# Patient Record
Sex: Female | Born: 1965 | Race: White | Hispanic: No | Marital: Married
Health system: Southern US, Community
[De-identification: ages and names within clinical notes are randomized; demographics above are authoritative.]

## PROBLEM LIST (undated history)

## (undated) DIAGNOSIS — I10 Essential (primary) hypertension: Secondary | ICD-10-CM

## (undated) DIAGNOSIS — K219 Gastro-esophageal reflux disease without esophagitis: Secondary | ICD-10-CM

## (undated) DIAGNOSIS — E785 Hyperlipidemia, unspecified: Secondary | ICD-10-CM

## (undated) DIAGNOSIS — E119 Type 2 diabetes mellitus without complications: Secondary | ICD-10-CM

## (undated) HISTORY — PX: CHOLECYSTECTOMY: SHX55

## (undated) HISTORY — DX: Gastro-esophageal reflux disease without esophagitis: K21.9

## (undated) HISTORY — DX: Essential (primary) hypertension: I10

## (undated) HISTORY — DX: Type 2 diabetes mellitus without complications: E11.9

## (undated) HISTORY — DX: Hyperlipidemia, unspecified: E78.5

---

## 1998-04-13 ENCOUNTER — Other Ambulatory Visit: Admission: RE | Admit: 1998-04-13 | Discharge: 1998-04-13 | Payer: Self-pay | Admitting: Family Medicine

## 1999-04-07 ENCOUNTER — Other Ambulatory Visit: Admission: RE | Admit: 1999-04-07 | Discharge: 1999-04-07 | Payer: Self-pay | Admitting: Family Medicine

## 2000-04-24 ENCOUNTER — Other Ambulatory Visit: Admission: RE | Admit: 2000-04-24 | Discharge: 2000-04-24 | Payer: Self-pay | Admitting: Family Medicine

## 2001-05-08 ENCOUNTER — Other Ambulatory Visit: Admission: RE | Admit: 2001-05-08 | Discharge: 2001-05-08 | Payer: Self-pay | Admitting: Family Medicine

## 2001-10-10 ENCOUNTER — Encounter: Admission: RE | Admit: 2001-10-10 | Discharge: 2001-10-10 | Payer: Self-pay | Admitting: Family Medicine

## 2001-10-10 ENCOUNTER — Encounter: Payer: Self-pay | Admitting: Family Medicine

## 2002-05-21 ENCOUNTER — Other Ambulatory Visit: Admission: RE | Admit: 2002-05-21 | Discharge: 2002-05-21 | Payer: Self-pay | Admitting: Family Medicine

## 2003-06-03 ENCOUNTER — Encounter: Admission: RE | Admit: 2003-06-03 | Discharge: 2003-06-03 | Payer: Self-pay | Admitting: Family Medicine

## 2003-12-22 ENCOUNTER — Encounter: Admission: RE | Admit: 2003-12-22 | Discharge: 2003-12-22 | Payer: Self-pay | Admitting: Family Medicine

## 2004-07-28 ENCOUNTER — Other Ambulatory Visit: Admission: RE | Admit: 2004-07-28 | Discharge: 2004-07-28 | Payer: Self-pay | Admitting: Family Medicine

## 2004-07-28 ENCOUNTER — Encounter: Admission: RE | Admit: 2004-07-28 | Discharge: 2004-07-28 | Payer: Self-pay | Admitting: Family Medicine

## 2004-08-04 ENCOUNTER — Encounter: Admission: RE | Admit: 2004-08-04 | Discharge: 2004-08-04 | Payer: Self-pay | Admitting: Family Medicine

## 2005-08-22 ENCOUNTER — Other Ambulatory Visit: Admission: RE | Admit: 2005-08-22 | Discharge: 2005-08-22 | Payer: Self-pay | Admitting: Family Medicine

## 2005-08-22 ENCOUNTER — Encounter: Admission: RE | Admit: 2005-08-22 | Discharge: 2005-08-22 | Payer: Self-pay | Admitting: Family Medicine

## 2005-09-23 ENCOUNTER — Encounter: Admission: RE | Admit: 2005-09-23 | Discharge: 2005-09-23 | Payer: Self-pay | Admitting: Family Medicine

## 2006-10-16 ENCOUNTER — Encounter: Admission: RE | Admit: 2006-10-16 | Discharge: 2006-10-16 | Payer: Self-pay | Admitting: Family Medicine

## 2006-10-16 ENCOUNTER — Other Ambulatory Visit: Admission: RE | Admit: 2006-10-16 | Discharge: 2006-10-16 | Payer: Self-pay | Admitting: Family Medicine

## 2008-01-16 ENCOUNTER — Encounter: Admission: RE | Admit: 2008-01-16 | Discharge: 2008-01-16 | Payer: Self-pay | Admitting: Family Medicine

## 2008-01-23 ENCOUNTER — Encounter: Admission: RE | Admit: 2008-01-23 | Discharge: 2008-01-23 | Payer: Self-pay | Admitting: Family Medicine

## 2008-01-28 ENCOUNTER — Other Ambulatory Visit: Admission: RE | Admit: 2008-01-28 | Discharge: 2008-01-28 | Payer: Self-pay | Admitting: Family Medicine

## 2008-12-22 ENCOUNTER — Other Ambulatory Visit: Admission: RE | Admit: 2008-12-22 | Discharge: 2008-12-22 | Payer: Self-pay | Admitting: Obstetrics and Gynecology

## 2009-01-16 ENCOUNTER — Encounter: Admission: RE | Admit: 2009-01-16 | Discharge: 2009-01-16 | Payer: Self-pay | Admitting: Family Medicine

## 2010-01-18 ENCOUNTER — Encounter: Admission: RE | Admit: 2010-01-18 | Discharge: 2010-01-18 | Payer: Self-pay | Admitting: Family Medicine

## 2010-05-02 ENCOUNTER — Encounter: Payer: Self-pay | Admitting: Family Medicine

## 2010-05-05 ENCOUNTER — Other Ambulatory Visit
Admission: RE | Admit: 2010-05-05 | Discharge: 2010-05-05 | Payer: Self-pay | Source: Home / Self Care | Admitting: Family Medicine

## 2010-05-05 ENCOUNTER — Other Ambulatory Visit: Payer: Self-pay | Admitting: Family Medicine

## 2011-01-07 ENCOUNTER — Other Ambulatory Visit: Payer: Self-pay | Admitting: Family Medicine

## 2011-01-07 DIAGNOSIS — Z1231 Encounter for screening mammogram for malignant neoplasm of breast: Secondary | ICD-10-CM

## 2011-01-24 ENCOUNTER — Ambulatory Visit: Payer: Self-pay

## 2011-02-03 ENCOUNTER — Ambulatory Visit
Admission: RE | Admit: 2011-02-03 | Discharge: 2011-02-03 | Disposition: A | Discharge status: Home / Self Care | Payer: 59 | Source: Ambulatory Visit | Attending: Family Medicine | Admitting: Family Medicine

## 2011-02-03 DIAGNOSIS — Z1231 Encounter for screening mammogram for malignant neoplasm of breast: Secondary | ICD-10-CM

## 2011-02-09 ENCOUNTER — Other Ambulatory Visit: Payer: Self-pay | Admitting: Family Medicine

## 2011-02-09 DIAGNOSIS — R928 Other abnormal and inconclusive findings on diagnostic imaging of breast: Secondary | ICD-10-CM

## 2011-02-23 ENCOUNTER — Ambulatory Visit
Admission: RE | Admit: 2011-02-23 | Discharge: 2011-02-23 | Disposition: A | Discharge status: Home / Self Care | Payer: 59 | Source: Ambulatory Visit | Attending: Family Medicine | Admitting: Family Medicine

## 2011-02-23 DIAGNOSIS — R928 Other abnormal and inconclusive findings on diagnostic imaging of breast: Secondary | ICD-10-CM

## 2011-06-01 ENCOUNTER — Other Ambulatory Visit (HOSPITAL_COMMUNITY)
Admission: RE | Admit: 2011-06-01 | Discharge: 2011-06-01 | Disposition: A | Discharge status: Home / Self Care | Payer: Commercial Indemnity | Source: Ambulatory Visit | Attending: Obstetrics and Gynecology | Admitting: Obstetrics and Gynecology

## 2011-06-01 ENCOUNTER — Other Ambulatory Visit: Payer: Self-pay | Admitting: Nurse Practitioner

## 2011-06-01 DIAGNOSIS — Z01419 Encounter for gynecological examination (general) (routine) without abnormal findings: Secondary | ICD-10-CM | POA: Insufficient documentation

## 2011-06-01 DIAGNOSIS — R87810 Cervical high risk human papillomavirus (HPV) DNA test positive: Secondary | ICD-10-CM | POA: Insufficient documentation

## 2012-01-19 ENCOUNTER — Other Ambulatory Visit: Payer: Self-pay | Admitting: Family Medicine

## 2012-01-19 DIAGNOSIS — Z1231 Encounter for screening mammogram for malignant neoplasm of breast: Secondary | ICD-10-CM

## 2012-02-24 ENCOUNTER — Ambulatory Visit
Admission: RE | Admit: 2012-02-24 | Discharge: 2012-02-24 | Disposition: A | Discharge status: Home / Self Care | Payer: PRIVATE HEALTH INSURANCE | Source: Ambulatory Visit | Attending: Family Medicine | Admitting: Family Medicine

## 2012-02-24 DIAGNOSIS — Z1231 Encounter for screening mammogram for malignant neoplasm of breast: Secondary | ICD-10-CM

## 2012-05-31 ENCOUNTER — Other Ambulatory Visit (HOSPITAL_COMMUNITY)
Admission: RE | Admit: 2012-05-31 | Discharge: 2012-05-31 | Disposition: A | Discharge status: Home / Self Care | Payer: PRIVATE HEALTH INSURANCE | Source: Ambulatory Visit | Attending: Obstetrics and Gynecology | Admitting: Obstetrics and Gynecology

## 2012-05-31 ENCOUNTER — Other Ambulatory Visit: Payer: Self-pay | Admitting: Nurse Practitioner

## 2012-05-31 DIAGNOSIS — Z01419 Encounter for gynecological examination (general) (routine) without abnormal findings: Secondary | ICD-10-CM | POA: Insufficient documentation

## 2012-05-31 DIAGNOSIS — Z1151 Encounter for screening for human papillomavirus (HPV): Secondary | ICD-10-CM | POA: Insufficient documentation

## 2012-09-13 ENCOUNTER — Other Ambulatory Visit: Payer: Self-pay | Admitting: Obstetrics and Gynecology

## 2012-09-13 DIAGNOSIS — N644 Mastodynia: Secondary | ICD-10-CM

## 2012-09-13 DIAGNOSIS — N6320 Unspecified lump in the left breast, unspecified quadrant: Secondary | ICD-10-CM

## 2012-09-27 ENCOUNTER — Ambulatory Visit
Admission: RE | Admit: 2012-09-27 | Discharge: 2012-09-27 | Disposition: A | Discharge status: Home / Self Care | Payer: PRIVATE HEALTH INSURANCE | Source: Ambulatory Visit | Attending: Obstetrics and Gynecology | Admitting: Obstetrics and Gynecology

## 2012-09-27 DIAGNOSIS — N644 Mastodynia: Secondary | ICD-10-CM

## 2012-09-27 DIAGNOSIS — N6320 Unspecified lump in the left breast, unspecified quadrant: Secondary | ICD-10-CM

## 2013-02-05 ENCOUNTER — Other Ambulatory Visit: Payer: Self-pay | Admitting: Family Medicine

## 2013-02-05 DIAGNOSIS — K921 Melena: Secondary | ICD-10-CM

## 2013-02-07 ENCOUNTER — Ambulatory Visit
Admission: RE | Admit: 2013-02-07 | Discharge: 2013-02-07 | Disposition: A | Discharge status: Home / Self Care | Payer: 59 | Source: Ambulatory Visit | Attending: Family Medicine | Admitting: Family Medicine

## 2013-02-07 DIAGNOSIS — K921 Melena: Secondary | ICD-10-CM

## 2013-02-07 MED ORDER — IOHEXOL 300 MG/ML  SOLN
100.0000 mL | Freq: Once | INTRAMUSCULAR | Status: AC | PRN
  Administered 2013-02-07: 100 mL via INTRAVENOUS

## 2013-04-15 ENCOUNTER — Other Ambulatory Visit: Payer: Self-pay | Admitting: Physician Assistant

## 2013-04-15 DIAGNOSIS — N63 Unspecified lump in unspecified breast: Secondary | ICD-10-CM

## 2013-04-18 ENCOUNTER — Ambulatory Visit
Admission: RE | Admit: 2013-04-18 | Discharge: 2013-04-18 | Disposition: A | Discharge status: Home / Self Care | Payer: PRIVATE HEALTH INSURANCE | Source: Ambulatory Visit | Attending: Physician Assistant | Admitting: Physician Assistant

## 2013-04-18 DIAGNOSIS — N63 Unspecified lump in unspecified breast: Secondary | ICD-10-CM

## 2014-05-21 ENCOUNTER — Other Ambulatory Visit: Payer: Self-pay

## 2014-05-21 DIAGNOSIS — Z1231 Encounter for screening mammogram for malignant neoplasm of breast: Secondary | ICD-10-CM

## 2014-06-06 ENCOUNTER — Other Ambulatory Visit: Payer: Self-pay

## 2014-06-06 ENCOUNTER — Ambulatory Visit
Admission: RE | Admit: 2014-06-06 | Discharge: 2014-06-06 | Disposition: A | Discharge status: Home / Self Care | Payer: 59 | Source: Ambulatory Visit

## 2014-06-06 ENCOUNTER — Encounter (INDEPENDENT_AMBULATORY_CARE_PROVIDER_SITE_OTHER): Payer: Self-pay

## 2014-06-06 DIAGNOSIS — Z1231 Encounter for screening mammogram for malignant neoplasm of breast: Secondary | ICD-10-CM

## 2014-10-29 IMAGING — US US BREAST*L*
1 series · 6 of 6 positions shown · non-contrast
Comparison: 02/24/2012, 02/23/2011, 02/03/2011, 01/18/2010.

CLINICAL DATA: Referring physician notes palpable mass within the
superior left breast at the 12 o'clock position.  There is a family
history of breast cancer in a paternal aunt.

DIGITAL DIAGNOSTIC LEFT BREAST MAMMOGRAM WITH CAD AND LEFT BREAST
ULTRASOUND:

[Series 1: us breast*left* · 6 of 6 slices shown]
[im 1/6]
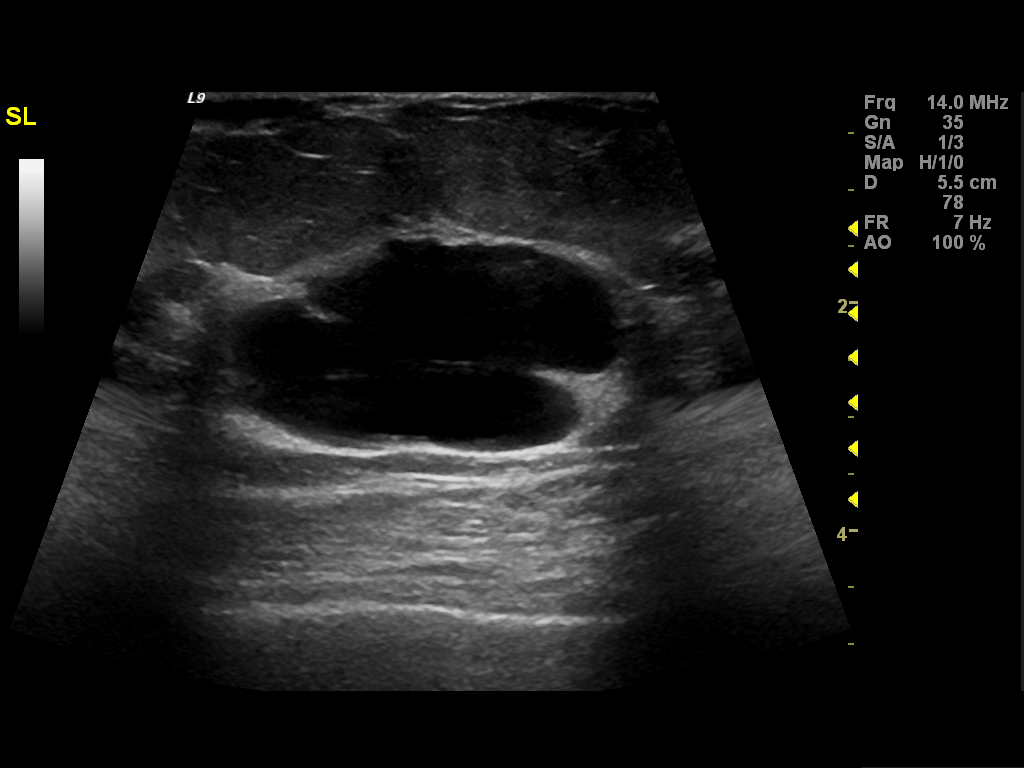
[im 2/6]
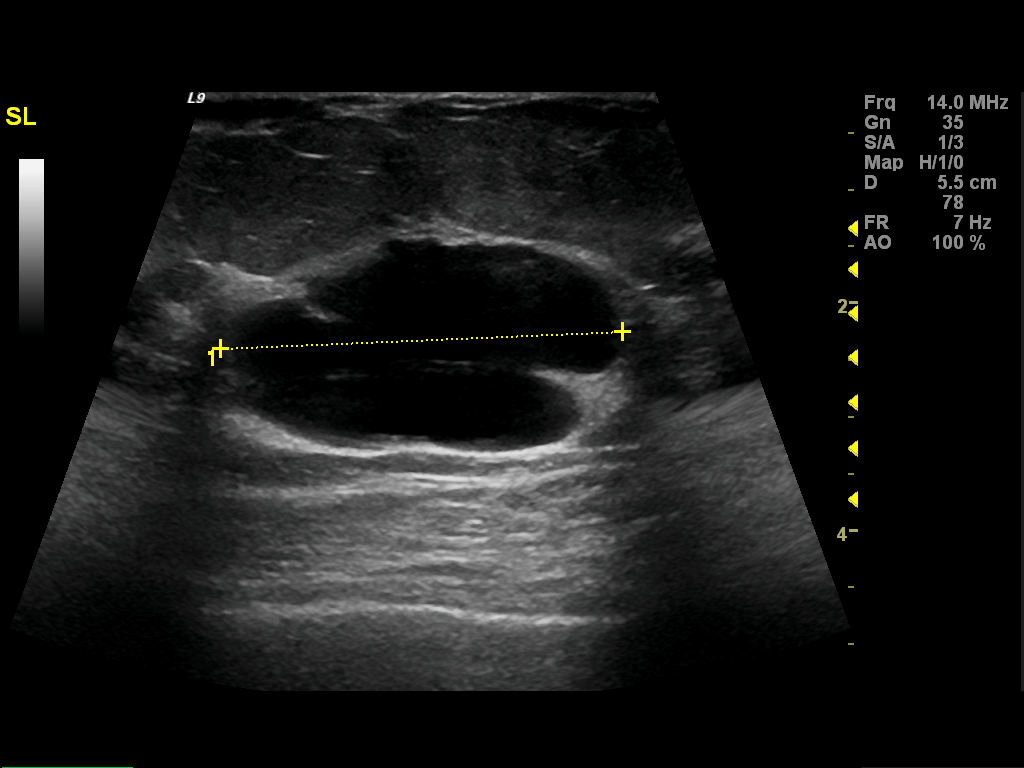
[im 3/6]
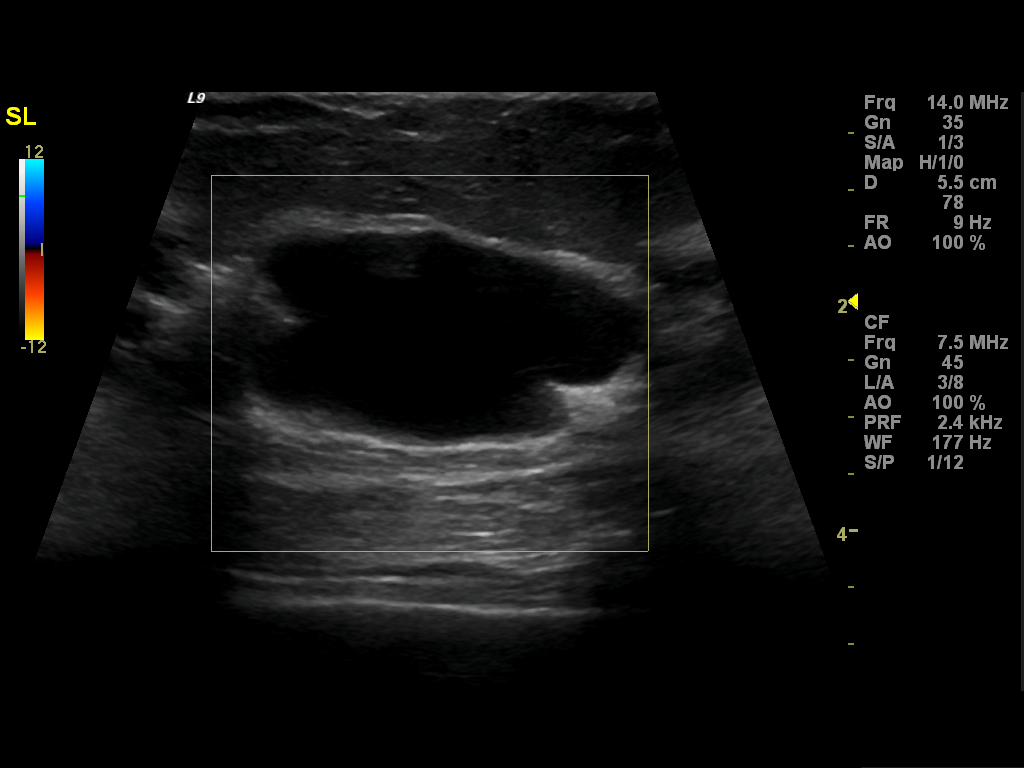
[im 4/6]
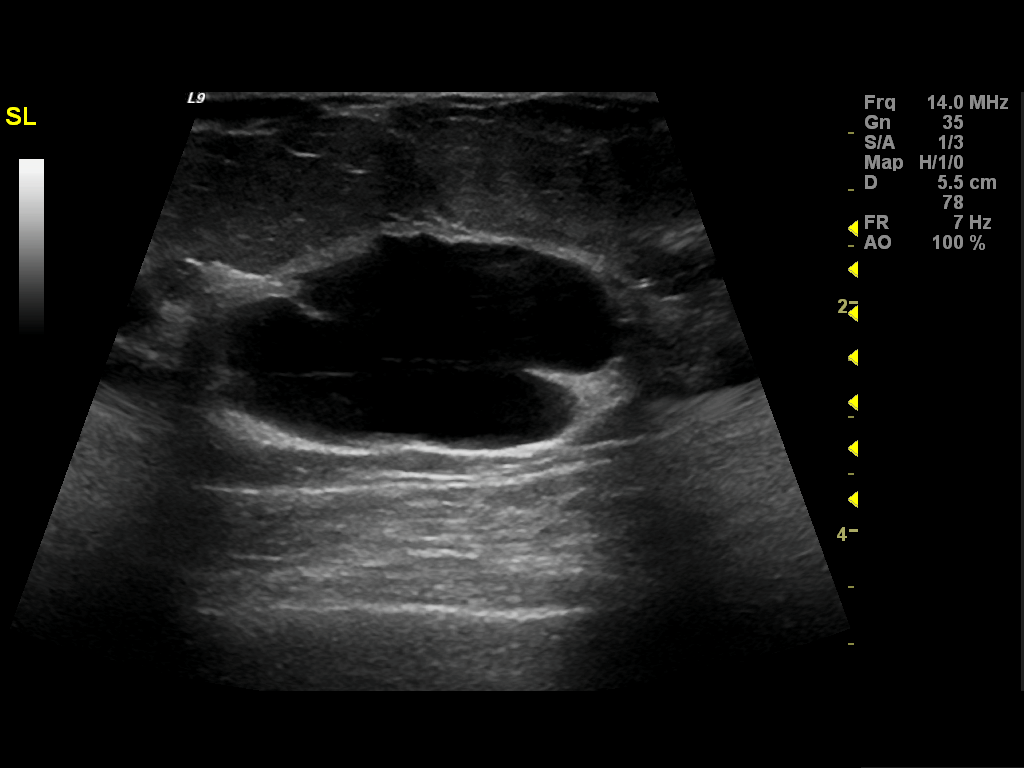
[im 5/6]
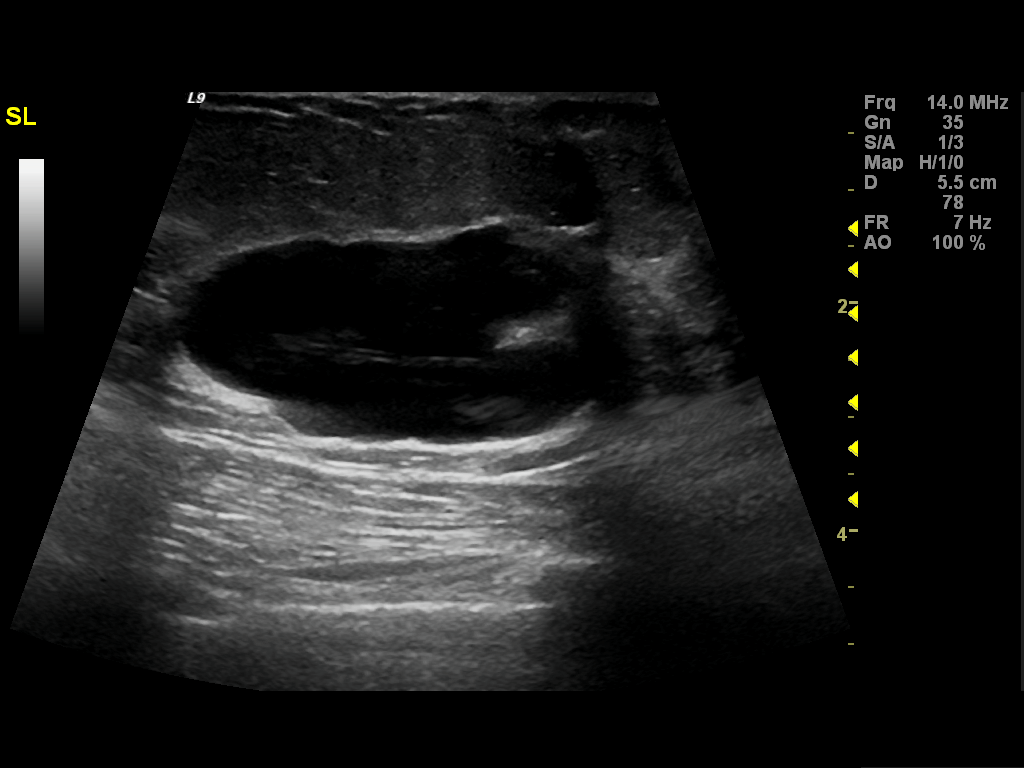
[im 6/6]
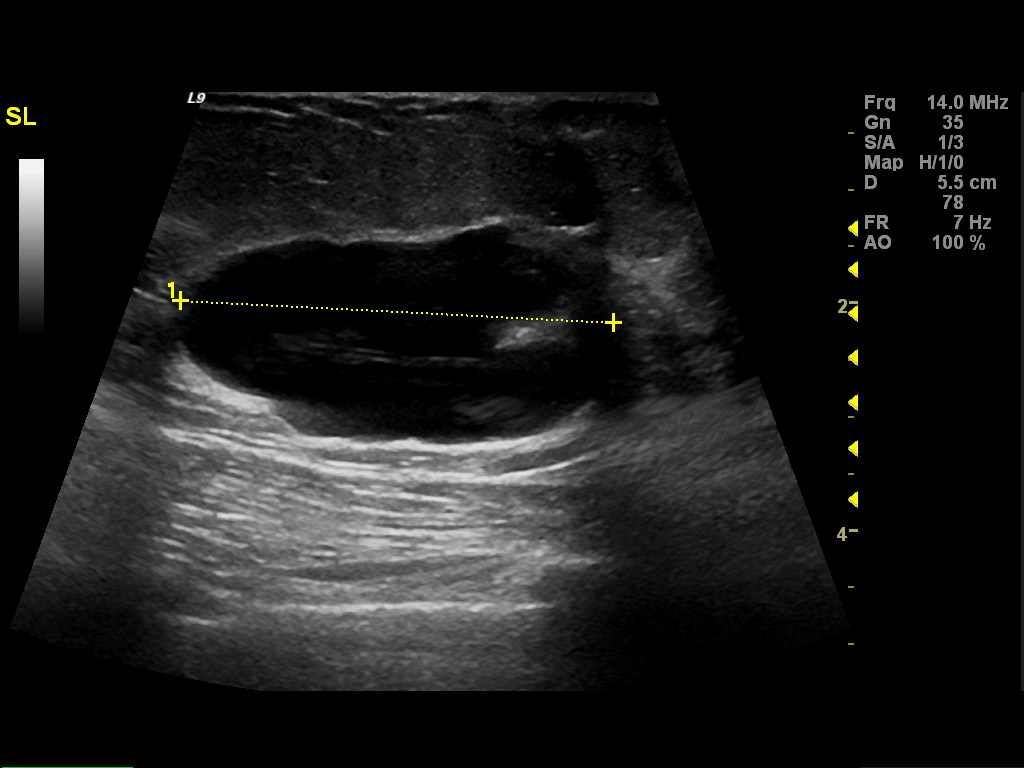

[6 of 6 positions shown; findings below may reference images not displayed]

FINDINGS: ACR Breast Density Category 2: There is a scattered fibroglandular
pattern.

There is a circumscribed, oval mass located within the left breast
at the 12 o'clock position.  There is no distortion or worrisome
calcification.  This corresponds to the previously seen cyst
located at the 12 o'clock position.

Mammographic images were processed with CAD.

On physical exam, there is a mobile, soft palpable mass located
within the left breast at 12 o'clock position 3 cm from nipple.  By
physical examination this measures approximately 4 cm in size.

Ultrasound is performed, showing a simple cyst measuring 3.8 cm in
size located within the left breast at 12 o'clock position 3 cm
from nipple corresponding to the palpable finding.  There are no
additional findings.
IMPRESSION: 3.8 cm simple cyst located within the left breast at the 12 o'clock
position 3 cm from nipple.

RECOMMENDATION:
Screening mammography in February 2013.

I have discussed the findings and recommendations with the patient.
Results were also provided in writing at the conclusion of the
visit.  If applicable, a reminder letter will be sent to the
patient regarding the next appointment.

BI-RADS CATEGORY 2:  Benign finding(s).

## 2015-06-18 ENCOUNTER — Other Ambulatory Visit: Payer: Self-pay

## 2015-06-18 DIAGNOSIS — Z1231 Encounter for screening mammogram for malignant neoplasm of breast: Secondary | ICD-10-CM

## 2015-07-03 ENCOUNTER — Ambulatory Visit
Admission: RE | Admit: 2015-07-03 | Discharge: 2015-07-03 | Disposition: A | Discharge status: Home / Self Care | Payer: 59 | Source: Ambulatory Visit

## 2015-07-03 DIAGNOSIS — Z1231 Encounter for screening mammogram for malignant neoplasm of breast: Secondary | ICD-10-CM

## 2015-08-17 DIAGNOSIS — K760 Fatty (change of) liver, not elsewhere classified: Secondary | ICD-10-CM | POA: Insufficient documentation

## 2016-05-25 ENCOUNTER — Other Ambulatory Visit: Payer: Self-pay | Admitting: Family Medicine

## 2016-05-25 DIAGNOSIS — Z1231 Encounter for screening mammogram for malignant neoplasm of breast: Secondary | ICD-10-CM

## 2016-07-04 ENCOUNTER — Ambulatory Visit
Admission: RE | Admit: 2016-07-04 | Discharge: 2016-07-04 | Disposition: A | Discharge status: Home / Self Care | Payer: 59 | Source: Ambulatory Visit | Attending: Family Medicine | Admitting: Family Medicine

## 2016-07-04 DIAGNOSIS — Z1231 Encounter for screening mammogram for malignant neoplasm of breast: Secondary | ICD-10-CM

## 2016-12-06 DIAGNOSIS — Z79890 Hormone replacement therapy (postmenopausal): Secondary | ICD-10-CM | POA: Insufficient documentation

## 2017-06-07 ENCOUNTER — Other Ambulatory Visit: Payer: Self-pay | Admitting: Family Medicine

## 2017-06-07 DIAGNOSIS — Z1231 Encounter for screening mammogram for malignant neoplasm of breast: Secondary | ICD-10-CM

## 2017-07-05 ENCOUNTER — Ambulatory Visit: Payer: 59

## 2017-07-06 ENCOUNTER — Ambulatory Visit
Admission: RE | Admit: 2017-07-06 | Discharge: 2017-07-06 | Disposition: A | Discharge status: Home / Self Care | Payer: 59 | Source: Ambulatory Visit | Attending: Family Medicine | Admitting: Family Medicine

## 2017-07-06 DIAGNOSIS — Z1231 Encounter for screening mammogram for malignant neoplasm of breast: Secondary | ICD-10-CM

## 2018-05-30 ENCOUNTER — Other Ambulatory Visit: Payer: Self-pay | Admitting: Family Medicine

## 2018-05-30 DIAGNOSIS — Z1231 Encounter for screening mammogram for malignant neoplasm of breast: Secondary | ICD-10-CM

## 2018-07-10 ENCOUNTER — Ambulatory Visit: Payer: 59

## 2018-08-09 ENCOUNTER — Ambulatory Visit: Payer: 59

## 2018-09-20 ENCOUNTER — Other Ambulatory Visit: Payer: Self-pay

## 2018-09-20 ENCOUNTER — Ambulatory Visit
Admission: RE | Admit: 2018-09-20 | Discharge: 2018-09-20 | Disposition: A | Discharge status: Home / Self Care | Payer: 59 | Source: Ambulatory Visit | Attending: Family Medicine | Admitting: Family Medicine

## 2018-09-20 DIAGNOSIS — Z1231 Encounter for screening mammogram for malignant neoplasm of breast: Secondary | ICD-10-CM

## 2019-08-15 ENCOUNTER — Other Ambulatory Visit: Payer: Self-pay | Admitting: Family Medicine

## 2019-08-15 DIAGNOSIS — Z1231 Encounter for screening mammogram for malignant neoplasm of breast: Secondary | ICD-10-CM

## 2019-09-23 ENCOUNTER — Other Ambulatory Visit: Payer: Self-pay

## 2019-09-23 ENCOUNTER — Ambulatory Visit
Admission: RE | Admit: 2019-09-23 | Discharge: 2019-09-23 | Disposition: A | Discharge status: Home / Self Care | Payer: 59 | Source: Ambulatory Visit | Attending: Family Medicine | Admitting: Family Medicine

## 2019-09-23 DIAGNOSIS — Z1231 Encounter for screening mammogram for malignant neoplasm of breast: Secondary | ICD-10-CM

## 2020-09-15 ENCOUNTER — Other Ambulatory Visit: Payer: Self-pay | Admitting: Family Medicine

## 2020-09-15 DIAGNOSIS — Z1231 Encounter for screening mammogram for malignant neoplasm of breast: Secondary | ICD-10-CM

## 2020-09-23 ENCOUNTER — Ambulatory Visit
Admission: RE | Admit: 2020-09-23 | Discharge: 2020-09-23 | Disposition: A | Discharge status: Home / Self Care | Payer: BC Managed Care – PPO | Source: Ambulatory Visit | Attending: Family Medicine | Admitting: Family Medicine

## 2020-09-23 ENCOUNTER — Other Ambulatory Visit: Payer: Self-pay

## 2020-09-23 DIAGNOSIS — Z1231 Encounter for screening mammogram for malignant neoplasm of breast: Secondary | ICD-10-CM

## 2021-02-23 DIAGNOSIS — H2511 Age-related nuclear cataract, right eye: Secondary | ICD-10-CM | POA: Diagnosis not present

## 2021-02-23 DIAGNOSIS — H2513 Age-related nuclear cataract, bilateral: Secondary | ICD-10-CM | POA: Diagnosis not present

## 2021-02-23 DIAGNOSIS — H18413 Arcus senilis, bilateral: Secondary | ICD-10-CM | POA: Diagnosis not present

## 2021-02-23 DIAGNOSIS — H25013 Cortical age-related cataract, bilateral: Secondary | ICD-10-CM | POA: Diagnosis not present

## 2021-02-23 DIAGNOSIS — H25043 Posterior subcapsular polar age-related cataract, bilateral: Secondary | ICD-10-CM | POA: Diagnosis not present

## 2021-05-05 DIAGNOSIS — H2511 Age-related nuclear cataract, right eye: Secondary | ICD-10-CM | POA: Diagnosis not present

## 2021-05-06 DIAGNOSIS — H2512 Age-related nuclear cataract, left eye: Secondary | ICD-10-CM | POA: Diagnosis not present

## 2021-05-06 DIAGNOSIS — Z961 Presence of intraocular lens: Secondary | ICD-10-CM | POA: Diagnosis not present

## 2021-05-19 DIAGNOSIS — H2512 Age-related nuclear cataract, left eye: Secondary | ICD-10-CM | POA: Diagnosis not present

## 2021-05-21 DIAGNOSIS — E785 Hyperlipidemia, unspecified: Secondary | ICD-10-CM | POA: Diagnosis not present

## 2021-05-21 DIAGNOSIS — E119 Type 2 diabetes mellitus without complications: Secondary | ICD-10-CM | POA: Diagnosis not present

## 2021-05-24 DIAGNOSIS — F419 Anxiety disorder, unspecified: Secondary | ICD-10-CM | POA: Diagnosis not present

## 2021-05-24 DIAGNOSIS — I10 Essential (primary) hypertension: Secondary | ICD-10-CM | POA: Diagnosis not present

## 2021-05-24 DIAGNOSIS — J449 Chronic obstructive pulmonary disease, unspecified: Secondary | ICD-10-CM | POA: Diagnosis not present

## 2021-05-24 DIAGNOSIS — E119 Type 2 diabetes mellitus without complications: Secondary | ICD-10-CM | POA: Diagnosis not present

## 2021-06-24 DIAGNOSIS — Z01419 Encounter for gynecological examination (general) (routine) without abnormal findings: Secondary | ICD-10-CM | POA: Diagnosis not present

## 2021-06-24 DIAGNOSIS — Z1151 Encounter for screening for human papillomavirus (HPV): Secondary | ICD-10-CM | POA: Diagnosis not present

## 2021-06-24 DIAGNOSIS — N951 Menopausal and female climacteric states: Secondary | ICD-10-CM | POA: Diagnosis not present

## 2021-06-24 DIAGNOSIS — Z79890 Hormone replacement therapy (postmenopausal): Secondary | ICD-10-CM | POA: Diagnosis not present

## 2021-06-24 DIAGNOSIS — Z124 Encounter for screening for malignant neoplasm of cervix: Secondary | ICD-10-CM | POA: Diagnosis not present

## 2021-09-01 ENCOUNTER — Other Ambulatory Visit: Payer: Self-pay | Admitting: Family Medicine

## 2021-09-01 DIAGNOSIS — Z1231 Encounter for screening mammogram for malignant neoplasm of breast: Secondary | ICD-10-CM

## 2021-09-28 ENCOUNTER — Ambulatory Visit
Admission: RE | Admit: 2021-09-28 | Discharge: 2021-09-28 | Disposition: A | Discharge status: Home / Self Care | Payer: BC Managed Care – PPO | Source: Ambulatory Visit | Attending: Family Medicine | Admitting: Family Medicine

## 2021-09-28 DIAGNOSIS — Z1231 Encounter for screening mammogram for malignant neoplasm of breast: Secondary | ICD-10-CM | POA: Diagnosis not present

## 2021-09-29 DIAGNOSIS — D225 Melanocytic nevi of trunk: Secondary | ICD-10-CM | POA: Diagnosis not present

## 2021-09-29 DIAGNOSIS — L820 Inflamed seborrheic keratosis: Secondary | ICD-10-CM | POA: Diagnosis not present

## 2021-09-29 DIAGNOSIS — B078 Other viral warts: Secondary | ICD-10-CM | POA: Diagnosis not present

## 2021-09-29 DIAGNOSIS — X32XXXD Exposure to sunlight, subsequent encounter: Secondary | ICD-10-CM | POA: Diagnosis not present

## 2021-09-29 DIAGNOSIS — L570 Actinic keratosis: Secondary | ICD-10-CM | POA: Diagnosis not present

## 2021-11-02 DIAGNOSIS — K635 Polyp of colon: Secondary | ICD-10-CM | POA: Diagnosis not present

## 2021-11-02 DIAGNOSIS — K5730 Diverticulosis of large intestine without perforation or abscess without bleeding: Secondary | ICD-10-CM | POA: Diagnosis not present

## 2021-11-02 DIAGNOSIS — D124 Benign neoplasm of descending colon: Secondary | ICD-10-CM | POA: Diagnosis not present

## 2021-11-02 DIAGNOSIS — Z1211 Encounter for screening for malignant neoplasm of colon: Secondary | ICD-10-CM | POA: Diagnosis not present

## 2021-11-02 DIAGNOSIS — Z86010 Personal history of colonic polyps: Secondary | ICD-10-CM | POA: Diagnosis not present

## 2021-11-02 DIAGNOSIS — K648 Other hemorrhoids: Secondary | ICD-10-CM | POA: Diagnosis not present

## 2021-11-18 DIAGNOSIS — F419 Anxiety disorder, unspecified: Secondary | ICD-10-CM | POA: Diagnosis not present

## 2021-11-18 DIAGNOSIS — J449 Chronic obstructive pulmonary disease, unspecified: Secondary | ICD-10-CM | POA: Diagnosis not present

## 2021-11-18 DIAGNOSIS — K760 Fatty (change of) liver, not elsewhere classified: Secondary | ICD-10-CM | POA: Diagnosis not present

## 2021-11-18 DIAGNOSIS — F17210 Nicotine dependence, cigarettes, uncomplicated: Secondary | ICD-10-CM | POA: Diagnosis not present

## 2021-11-18 DIAGNOSIS — Z79899 Other long term (current) drug therapy: Secondary | ICD-10-CM | POA: Diagnosis not present

## 2021-11-18 DIAGNOSIS — F3289 Other specified depressive episodes: Secondary | ICD-10-CM | POA: Diagnosis not present

## 2021-11-18 DIAGNOSIS — E785 Hyperlipidemia, unspecified: Secondary | ICD-10-CM | POA: Diagnosis not present

## 2021-11-18 DIAGNOSIS — Z1329 Encounter for screening for other suspected endocrine disorder: Secondary | ICD-10-CM | POA: Diagnosis not present

## 2021-11-18 DIAGNOSIS — Z0001 Encounter for general adult medical examination with abnormal findings: Secondary | ICD-10-CM | POA: Diagnosis not present

## 2021-11-18 DIAGNOSIS — K219 Gastro-esophageal reflux disease without esophagitis: Secondary | ICD-10-CM | POA: Diagnosis not present

## 2021-11-18 DIAGNOSIS — R011 Cardiac murmur, unspecified: Secondary | ICD-10-CM | POA: Diagnosis not present

## 2021-11-18 DIAGNOSIS — Z7984 Long term (current) use of oral hypoglycemic drugs: Secondary | ICD-10-CM | POA: Diagnosis not present

## 2021-11-18 DIAGNOSIS — E119 Type 2 diabetes mellitus without complications: Secondary | ICD-10-CM | POA: Diagnosis not present

## 2021-11-18 DIAGNOSIS — Z130 Encounter for screening for diseases of the blood and blood-forming organs and certain disorders involving the immune mechanism: Secondary | ICD-10-CM | POA: Diagnosis not present

## 2021-11-18 DIAGNOSIS — E876 Hypokalemia: Secondary | ICD-10-CM | POA: Diagnosis not present

## 2021-11-18 DIAGNOSIS — I10 Essential (primary) hypertension: Secondary | ICD-10-CM | POA: Diagnosis not present

## 2021-11-23 DIAGNOSIS — Z0000 Encounter for general adult medical examination without abnormal findings: Secondary | ICD-10-CM | POA: Diagnosis not present

## 2021-11-23 DIAGNOSIS — F32A Depression, unspecified: Secondary | ICD-10-CM | POA: Diagnosis not present

## 2021-11-23 DIAGNOSIS — I10 Essential (primary) hypertension: Secondary | ICD-10-CM | POA: Diagnosis not present

## 2021-11-23 DIAGNOSIS — E119 Type 2 diabetes mellitus without complications: Secondary | ICD-10-CM | POA: Diagnosis not present

## 2021-11-23 DIAGNOSIS — F419 Anxiety disorder, unspecified: Secondary | ICD-10-CM | POA: Diagnosis not present

## 2021-11-23 DIAGNOSIS — Z23 Encounter for immunization: Secondary | ICD-10-CM | POA: Diagnosis not present

## 2021-12-09 DIAGNOSIS — F17200 Nicotine dependence, unspecified, uncomplicated: Secondary | ICD-10-CM | POA: Diagnosis not present

## 2021-12-09 DIAGNOSIS — F17210 Nicotine dependence, cigarettes, uncomplicated: Secondary | ICD-10-CM | POA: Diagnosis not present

## 2021-12-23 DIAGNOSIS — R011 Cardiac murmur, unspecified: Secondary | ICD-10-CM | POA: Diagnosis not present

## 2022-03-08 DIAGNOSIS — H53143 Visual discomfort, bilateral: Secondary | ICD-10-CM | POA: Diagnosis not present

## 2022-03-08 DIAGNOSIS — I10 Essential (primary) hypertension: Secondary | ICD-10-CM | POA: Diagnosis not present

## 2022-03-08 DIAGNOSIS — F320 Major depressive disorder, single episode, mild: Secondary | ICD-10-CM | POA: Diagnosis not present

## 2022-03-08 DIAGNOSIS — E876 Hypokalemia: Secondary | ICD-10-CM | POA: Diagnosis not present

## 2022-03-08 DIAGNOSIS — F17200 Nicotine dependence, unspecified, uncomplicated: Secondary | ICD-10-CM | POA: Diagnosis not present

## 2022-03-08 DIAGNOSIS — I340 Nonrheumatic mitral (valve) insufficiency: Secondary | ICD-10-CM | POA: Diagnosis not present

## 2022-05-20 ENCOUNTER — Ambulatory Visit (INDEPENDENT_AMBULATORY_CARE_PROVIDER_SITE_OTHER): Payer: BC Managed Care – PPO | Admitting: Family Medicine

## 2022-05-20 ENCOUNTER — Encounter: Payer: Self-pay | Admitting: Family Medicine

## 2022-05-20 VITALS — BP 112/50 | HR 85 | Temp 97.9°F | Ht 64.0 in | Wt 146.0 lb

## 2022-05-20 DIAGNOSIS — I10 Essential (primary) hypertension: Secondary | ICD-10-CM | POA: Insufficient documentation

## 2022-05-20 DIAGNOSIS — F17200 Nicotine dependence, unspecified, uncomplicated: Secondary | ICD-10-CM

## 2022-05-20 DIAGNOSIS — E119 Type 2 diabetes mellitus without complications: Secondary | ICD-10-CM

## 2022-05-20 DIAGNOSIS — J449 Chronic obstructive pulmonary disease, unspecified: Secondary | ICD-10-CM | POA: Insufficient documentation

## 2022-05-20 DIAGNOSIS — F419 Anxiety disorder, unspecified: Secondary | ICD-10-CM | POA: Insufficient documentation

## 2022-05-20 DIAGNOSIS — E785 Hyperlipidemia, unspecified: Secondary | ICD-10-CM | POA: Diagnosis not present

## 2022-05-20 NOTE — Patient Instructions (Signed)
Work on smoking cessation.  Follow up in 6 months.  Take care  Dr. Lacinda Axon

## 2022-05-20 NOTE — Assessment & Plan Note (Signed)
Discussed Chantix.  Patient will consider.

## 2022-05-20 NOTE — Assessment & Plan Note (Signed)
A1c at goal.  Continue metformin.

## 2022-05-20 NOTE — Assessment & Plan Note (Signed)
Lipids at goal.  Continue Lipitor.

## 2022-05-20 NOTE — Progress Notes (Signed)
Subjective:  Patient ID: Debbie Mcdaniel, female    DOB: 08-09-1965  Age: 57 y.o. MRN: [id number]  CC: Chief Complaint  Patient presents with   Establish Care    HPI:  57 year old female with hypertension, COPD, tobacco abuse, fatty liver disease, type 2 diabetes, hyperlipidemia, anxiety presents to establish care.  Patient has type 2 diabetes.  She is on metformin XR 5 5 mg daily.  Most recent A1c was in August and was 6.5.  Patient's hypertension is well-controlled on amlodipine 2.5 mg daily, losartan/HCTZ 100-25.  Patient's lipids are well-controlled on atorvastatin 40 mg daily.  Last LDL was 61.  Patient is getting regular CT lung cancer screening.  She continues to smoke.  She has tried Wellbutrin in the past.  Will discuss Chantix today.  Patient is on HRT and is managed by OB/GYN.  Social Hx   Social History   Socioeconomic History   Marital status: Married    Spouse name: Not on file   Number of children: Not on file   Years of education: Not on file   Highest education level: Not on file  Occupational History   Not on file  Tobacco Use   Smoking status: Not on file   Smokeless tobacco: Not on file  Substance and Sexual Activity   Alcohol use: Not on file   Drug use: Not on file   Sexual activity: Not on file  Other Topics Concern   Not on file  Social History Narrative   Not on file   Social Determinants of Health   Financial Resource Strain: Not on file  Food Insecurity: Not on file  Transportation Needs: Not on file  Physical Activity: Not on file  Stress: Not on file  Social Connections: Not on file    Review of Systems  Constitutional: Negative.   Respiratory:         Some shortness of breath with exertion.  Cardiovascular: Negative.     Objective:  BP (!) 112/50   Pulse 85   Temp 97.9 F (36.6 C)   Ht 5' 4"$  (1.626 m)   Wt 146 lb (66.2 kg)   SpO2 98%   BMI 25.06 kg/m      05/20/2022    9:41 AM  BP/Weight  Systolic BP  [id number]  Diastolic BP 50  Wt. (Lbs) 146  BMI 25.06 kg/m2    Physical Exam Vitals and nursing note reviewed.  Constitutional:      General: She is not in acute distress.    Appearance: Normal appearance.  HENT:     Head: Normocephalic and atraumatic.  Eyes:     General:        Right eye: No discharge.        Left eye: No discharge.     Conjunctiva/sclera: Conjunctivae normal.  Cardiovascular:     Rate and Rhythm: Normal rate and regular rhythm.  Pulmonary:     Effort: Pulmonary effort is normal.     Breath sounds: Normal breath sounds. No wheezing, rhonchi or rales.  Neurological:     Mental Status: She is alert.  Psychiatric:        Mood and Affect: Mood normal.        Behavior: Behavior normal.    Assessment & Plan:   Problem List Items Addressed This Visit       Cardiovascular and Mediastinum   Benign essential hypertension - Primary    Well-controlled.  Continue current medications.  Relevant Medications   atorvastatin (LIPITOR) 40 MG tablet   amLODipine (NORVASC) 2.5 MG tablet   losartan-hydrochlorothiazide (HYZAAR) 100-25 MG tablet     Endocrine   Type 2 diabetes mellitus without complication, without long-term current use of insulin (HCC)    A1c at goal.  Continue metformin.      Relevant Medications   atorvastatin (LIPITOR) 40 MG tablet   metFORMIN (GLUCOPHAGE-XR) 500 MG 24 hr tablet   losartan-hydrochlorothiazide (HYZAAR) 100-25 MG tablet     Other   Hyperlipidemia    Lipids at goal.  Continue Lipitor.      Relevant Medications   atorvastatin (LIPITOR) 40 MG tablet   amLODipine (NORVASC) 2.5 MG tablet   losartan-hydrochlorothiazide (HYZAAR) 100-25 MG tablet   Current every day smoker    Discussed Chantix.  Patient will consider.      Follow-up:  Return in about 6 months (around 11/18/2022).  [place]

## 2022-05-20 NOTE — Assessment & Plan Note (Signed)
Well controlled. Continue current medications  

## 2022-09-16 ENCOUNTER — Other Ambulatory Visit: Payer: Self-pay | Admitting: Family Medicine

## 2022-09-16 DIAGNOSIS — Z1231 Encounter for screening mammogram for malignant neoplasm of breast: Secondary | ICD-10-CM

## 2022-10-06 ENCOUNTER — Ambulatory Visit
Admission: RE | Admit: 2022-10-06 | Discharge: 2022-10-06 | Disposition: A | Discharge status: Home / Self Care | Payer: BC Managed Care – PPO | Source: Ambulatory Visit | Attending: Family Medicine | Admitting: Family Medicine

## 2022-10-06 DIAGNOSIS — Z1231 Encounter for screening mammogram for malignant neoplasm of breast: Secondary | ICD-10-CM

## 2022-11-17 ENCOUNTER — Other Ambulatory Visit: Payer: Self-pay | Admitting: Family Medicine

## 2022-11-17 DIAGNOSIS — E785 Hyperlipidemia, unspecified: Secondary | ICD-10-CM | POA: Diagnosis not present

## 2022-11-17 DIAGNOSIS — E119 Type 2 diabetes mellitus without complications: Secondary | ICD-10-CM

## 2022-11-17 DIAGNOSIS — Z130 Encounter for screening for diseases of the blood and blood-forming organs and certain disorders involving the immune mechanism: Secondary | ICD-10-CM

## 2022-11-23 ENCOUNTER — Encounter: Payer: BC Managed Care – PPO | Admitting: Family Medicine

## 2022-11-25 ENCOUNTER — Ambulatory Visit: Payer: BC Managed Care – PPO | Admitting: Family Medicine

## 2022-11-25 VITALS — BP 142/79 | HR 85 | Temp 98.1°F | Ht 64.76 in | Wt 147.8 lb

## 2022-11-25 DIAGNOSIS — G629 Polyneuropathy, unspecified: Secondary | ICD-10-CM | POA: Diagnosis not present

## 2022-11-25 DIAGNOSIS — H60502 Unspecified acute noninfective otitis externa, left ear: Secondary | ICD-10-CM

## 2022-11-25 DIAGNOSIS — F17200 Nicotine dependence, unspecified, uncomplicated: Secondary | ICD-10-CM

## 2022-11-25 DIAGNOSIS — Z0000 Encounter for general adult medical examination without abnormal findings: Secondary | ICD-10-CM

## 2022-11-25 DIAGNOSIS — I10 Essential (primary) hypertension: Secondary | ICD-10-CM

## 2022-11-25 DIAGNOSIS — E119 Type 2 diabetes mellitus without complications: Secondary | ICD-10-CM

## 2022-11-25 DIAGNOSIS — Z0001 Encounter for general adult medical examination with abnormal findings: Secondary | ICD-10-CM | POA: Diagnosis not present

## 2022-11-25 MED ORDER — PREGABALIN 75 MG PO CAPS: 75.0000 mg | ORAL_CAPSULE | Freq: Two times a day (BID) | ORAL | 1 refills | Status: DC

## 2022-11-25 MED ORDER — CIPROFLOXACIN-DEXAMETHASONE 0.3-0.1 % OT SUSP: 4.0000 [drp] | Freq: Two times a day (BID) | OTIC | 0 refills | Status: AC

## 2022-11-25 NOTE — Patient Instructions (Signed)
Get your eye exam.  Referral placed for lung cancer screening.  Medications as prescribed.  Follow up in 6 months

## 2022-11-27 DIAGNOSIS — G629 Polyneuropathy, unspecified: Secondary | ICD-10-CM | POA: Insufficient documentation

## 2022-11-27 DIAGNOSIS — H6090 Unspecified otitis externa, unspecified ear: Secondary | ICD-10-CM | POA: Insufficient documentation

## 2022-11-27 DIAGNOSIS — Z0000 Encounter for general adult medical examination without abnormal findings: Secondary | ICD-10-CM | POA: Insufficient documentation

## 2022-11-27 NOTE — Assessment & Plan Note (Signed)
Patient likely experiencing diabetic neuropathy.  Placing on Lyrica.

## 2022-11-27 NOTE — Assessment & Plan Note (Signed)
Treating with Ciprodex. 

## 2022-11-27 NOTE — Assessment & Plan Note (Signed)
BP mildly elevated today.  Will continue to monitor.

## 2022-11-27 NOTE — Assessment & Plan Note (Signed)
At goal.  Continue metformin. 

## 2022-11-27 NOTE — Assessment & Plan Note (Signed)
Preventative health section updated today.  Advised to get eye exam.  Referral placed for CT lung cancer screening. Labs reviewed.

## 2022-11-27 NOTE — Progress Notes (Signed)
Subjective:  Patient ID: Debbie Mcdaniel, female    DOB: 07-30-1965  Age: 57 y.o. MRN: [id number]  CC: Physical   HPI:  57 year old female with hypertension, COPD, fatty liver, type diabetes, hyperlipidemia, smoker presents for an annual physical.  Patient in need of a foot exam today.  Advised to get her eye exam.  She can get shingles vaccine at the pharmacy if she desires.  She is overdue for lung cancer screening.  Will need referral.  Has had recent labs.  A1c at goal.  LDL at goal.  Patient reports that she is having ongoing sharp pains in her feet particularly in the toes.  No heel pain.  Patient also reports ongoing left ear pain since Wednesday.  Patient believes that she has otitis external.  She would like me to examine it today.    Patient Active Problem List   Diagnosis Date Noted   Annual physical exam 11/27/2022   Otitis externa 11/27/2022   Neuropathy 11/27/2022   COPD (chronic obstructive pulmonary disease) (HCC) 05/20/2022   Current every day smoker 05/20/2022   Hyperlipidemia 05/20/2022   Type 2 diabetes mellitus without complication, without long-term current use of insulin (HCC) 05/20/2022   Benign essential hypertension 05/20/2022   Anxiety disorder 05/20/2022   Postmenopausal HRT (hormone replacement therapy) 12/06/2016   Fatty liver 08/17/2015    Social Hx   Social History   Socioeconomic History   Marital status: Married    Spouse name: Not on file   Number of children: Not on file   Years of education: Not on file   Highest education level: Not on file  Occupational History   Not on file  Tobacco Use   Smoking status: Not on file   Smokeless tobacco: Not on file  Substance and Sexual Activity   Alcohol use: Not on file   Drug use: Not on file   Sexual activity: Not on file  Other Topics Concern   Not on file  Social History Narrative   Not on file   Social Determinants of Health   Financial Resource Strain: Not on file   Food Insecurity: Not on file  Transportation Needs: Not on file  Physical Activity: Not on file  Stress: Not on file  Social Connections: Not on file    Review of Systems Per HPI  Objective:  BP (!) 142/79   Pulse 85   Temp 98.1 F (36.7 C)   Ht 5' 4.76" (1.645 m)   Wt 147 lb 12.8 oz (67 kg)   SpO2 97%   BMI 24.77 kg/m      11/25/2022    8:50 AM 05/20/2022    9:41 AM  BP/Weight  Systolic BP 142 112  Diastolic BP 79 50  Wt. (Lbs) 147.8 146  BMI 24.77 kg/m2 25.06 kg/m2    Physical Exam Vitals reviewed.  Constitutional:      General: She is not in acute distress.    Appearance: Normal appearance.  HENT:     Head: Normocephalic and atraumatic.     Ears:     Comments: Left ear canal with edema and debri consistent with Otitis externa.    Nose: Nose normal.  Eyes:     General:        Right eye: No discharge.        Left eye: No discharge.     Conjunctiva/sclera: Conjunctivae normal.  Cardiovascular:     Rate and Rhythm: Normal rate and regular rhythm.  Pulmonary:  Effort: Pulmonary effort is normal.     Breath sounds: Normal breath sounds. No wheezing, rhonchi or rales.  Abdominal:     General: There is no distension.     Palpations: Abdomen is soft.     Tenderness: There is no abdominal tenderness.  Skin:    General: Skin is warm.     Findings: No rash.  Neurological:     General: No focal deficit present.     Mental Status: She is alert.  Psychiatric:        Mood and Affect: Mood normal.        Behavior: Behavior normal.     Lab Results  Component Value Date   WBC 9.0 11/17/2022   HGB 15.0 11/17/2022   HCT 43.6 11/17/2022   PLT 184 11/17/2022   GLUCOSE 144 (H) 11/17/2022   CHOL 142 11/17/2022   TRIG 154 (H) 11/17/2022   HDL 47 11/17/2022   LDLCALC 69 11/17/2022   ALT 20 11/17/2022   AST 17 11/17/2022   NA 138 11/17/2022   K 3.9 11/17/2022   CL 98 11/17/2022   CREATININE 0.82 11/17/2022   BUN 8 11/17/2022   CO2 24 11/17/2022   HGBA1C  6.9 (H) 11/17/2022     Assessment & Plan:   Problem List Items Addressed This Visit       Cardiovascular and Mediastinum   Benign essential hypertension    BP mildly elevated today.  Will continue to monitor.        Endocrine   Type 2 diabetes mellitus without complication, without long-term current use of insulin (HCC)    At goal.  Continue metformin.        Nervous and Auditory   Otitis externa    Treating with Ciprodex.      Neuropathy    Patient likely experiencing diabetic neuropathy.  Placing on Lyrica.        Other   Current every day smoker   Relevant Orders   Ambulatory Referral Lung Cancer Screening [facility]   Annual physical exam - Primary    Preventative health section updated today.  Advised to get eye exam.  Referral placed for CT lung cancer screening. Labs reviewed.       Meds ordered this encounter  Medications   ciprofloxacin-dexamethasone (CIPRODEX) OTIC suspension    Sig: Place 4 drops into the left ear 2 (two) times daily for 7 days.    Dispense:  7.5 mL    Refill:  0   pregabalin (LYRICA) 75 MG capsule    Sig: Take 1 capsule (75 mg total) by mouth 2 (two) times daily.    Dispense:  60 capsule    Refill:  1    Follow-up:  Return in about 6 months (around 05/28/2023).  Everlene Other DO [facility]

## 2022-12-13 ENCOUNTER — Telehealth: Payer: Self-pay | Admitting: Family Medicine

## 2022-12-13 DIAGNOSIS — E119 Type 2 diabetes mellitus without complications: Secondary | ICD-10-CM

## 2022-12-13 MED ORDER — METFORMIN HCL ER 500 MG PO TB24
500.0000 mg | ORAL_TABLET | Freq: Every day | ORAL | 1 refills | Status: DC
Start: 2022-12-13 — End: 2023-05-24

## 2022-12-13 NOTE — Telephone Encounter (Signed)
Patient is requesting refill on metFORMIN (GLUCOPHAGE-XR) 500 MG 24 hr tablet  send to [facility]

## 2023-01-24 ENCOUNTER — Ambulatory Visit: Payer: BC Managed Care – PPO | Admitting: Family Medicine

## 2023-01-31 ENCOUNTER — Telehealth: Payer: Self-pay | Admitting: Family Medicine

## 2023-01-31 DIAGNOSIS — F17200 Nicotine dependence, unspecified, uncomplicated: Secondary | ICD-10-CM

## 2023-01-31 NOTE — Telephone Encounter (Signed)
Patient is wanting a lung screening setup as soon as possible she states this was discuss at her last visit.

## 2023-02-01 ENCOUNTER — Ambulatory Visit: Payer: BC Managed Care – PPO | Admitting: Family Medicine

## 2023-02-02 NOTE — Telephone Encounter (Signed)
Referral placed for Lung Cancer Screening

## 2023-02-02 NOTE — Telephone Encounter (Signed)
Debbie Sams, DO     Please place referral for Lung cancer screening through pulmonology [facility]).

## 2023-03-02 ENCOUNTER — Other Ambulatory Visit: Payer: Self-pay | Admitting: *Deleted

## 2023-03-02 DIAGNOSIS — Z87891 Personal history of nicotine dependence: Secondary | ICD-10-CM

## 2023-03-02 DIAGNOSIS — F17210 Nicotine dependence, cigarettes, uncomplicated: Secondary | ICD-10-CM

## 2023-03-02 DIAGNOSIS — Z122 Encounter for screening for malignant neoplasm of respiratory organs: Secondary | ICD-10-CM

## 2023-03-13 DIAGNOSIS — H35033 Hypertensive retinopathy, bilateral: Secondary | ICD-10-CM | POA: Diagnosis not present

## 2023-03-17 ENCOUNTER — Encounter: Payer: Self-pay | Admitting: Family Medicine

## 2023-03-20 ENCOUNTER — Other Ambulatory Visit: Payer: Self-pay | Admitting: Family Medicine

## 2023-03-20 MED ORDER — ALPRAZOLAM 0.25 MG PO TABS: 0.2500 mg | ORAL_TABLET | Freq: Two times a day (BID) | ORAL | 0 refills | Status: DC | PRN

## 2023-03-21 ENCOUNTER — Other Ambulatory Visit: Payer: Self-pay | Admitting: Family Medicine

## 2023-03-21 ENCOUNTER — Encounter: Payer: Self-pay | Admitting: Family Medicine

## 2023-03-21 MED ORDER — PREGABALIN 75 MG PO CAPS: 75.0000 mg | ORAL_CAPSULE | Freq: Two times a day (BID) | ORAL | 1 refills | Status: DC

## 2023-03-22 ENCOUNTER — Encounter: Payer: Self-pay | Admitting: Family Medicine

## 2023-04-14 ENCOUNTER — Ambulatory Visit (HOSPITAL_COMMUNITY)
Admission: RE | Admit: 2023-04-14 | Discharge: 2023-04-14 | Disposition: A | Discharge status: Home / Self Care | Payer: BC Managed Care – PPO | Source: Ambulatory Visit | Attending: Acute Care | Admitting: Acute Care

## 2023-04-14 DIAGNOSIS — Z122 Encounter for screening for malignant neoplasm of respiratory organs: Secondary | ICD-10-CM | POA: Diagnosis not present

## 2023-04-14 DIAGNOSIS — Z87891 Personal history of nicotine dependence: Secondary | ICD-10-CM

## 2023-04-14 DIAGNOSIS — F17210 Nicotine dependence, cigarettes, uncomplicated: Secondary | ICD-10-CM | POA: Diagnosis not present

## 2023-04-26 ENCOUNTER — Other Ambulatory Visit: Payer: Self-pay

## 2023-04-26 DIAGNOSIS — F17210 Nicotine dependence, cigarettes, uncomplicated: Secondary | ICD-10-CM

## 2023-04-26 DIAGNOSIS — Z122 Encounter for screening for malignant neoplasm of respiratory organs: Secondary | ICD-10-CM

## 2023-04-26 DIAGNOSIS — Z87891 Personal history of nicotine dependence: Secondary | ICD-10-CM

## 2023-05-24 ENCOUNTER — Other Ambulatory Visit: Payer: Self-pay | Admitting: Family Medicine

## 2023-05-24 DIAGNOSIS — E119 Type 2 diabetes mellitus without complications: Secondary | ICD-10-CM

## 2023-05-29 ENCOUNTER — Ambulatory Visit: Payer: BC Managed Care – PPO | Admitting: Family Medicine

## 2023-07-12 ENCOUNTER — Encounter: Payer: Self-pay | Admitting: Family Medicine

## 2023-07-13 ENCOUNTER — Other Ambulatory Visit: Payer: Self-pay

## 2023-07-13 MED ORDER — OMEPRAZOLE 40 MG PO CPDR: 40.0000 mg | DELAYED_RELEASE_CAPSULE | Freq: Every day | ORAL | 1 refills | Status: DC

## 2023-07-13 MED ORDER — ATORVASTATIN CALCIUM 40 MG PO TABS: 40.0000 mg | ORAL_TABLET | Freq: Every day | ORAL | 1 refills | Status: DC

## 2023-07-13 MED ORDER — LOSARTAN POTASSIUM-HCTZ 100-25 MG PO TABS: 1.0000 | ORAL_TABLET | Freq: Every day | ORAL | 1 refills | Status: DC

## 2023-07-13 MED ORDER — AMLODIPINE BESYLATE 2.5 MG PO TABS: 2.5000 mg | ORAL_TABLET | Freq: Every day | ORAL | 1 refills | Status: DC

## 2023-08-02 DIAGNOSIS — N951 Menopausal and female climacteric states: Secondary | ICD-10-CM | POA: Diagnosis not present

## 2023-08-02 DIAGNOSIS — Z01419 Encounter for gynecological examination (general) (routine) without abnormal findings: Secondary | ICD-10-CM | POA: Diagnosis not present

## 2023-09-19 ENCOUNTER — Telehealth: Payer: Self-pay

## 2023-09-19 ENCOUNTER — Other Ambulatory Visit: Payer: Self-pay | Admitting: Family Medicine

## 2023-09-19 DIAGNOSIS — Z1231 Encounter for screening mammogram for malignant neoplasm of breast: Secondary | ICD-10-CM

## 2023-09-19 NOTE — Telephone Encounter (Signed)
 Reason for CRM:Pt wants to have her labs done prior to appt. Please give her a call to schedule them. [place], [phone].

## 2023-09-25 ENCOUNTER — Other Ambulatory Visit: Payer: Self-pay

## 2023-09-25 DIAGNOSIS — E785 Hyperlipidemia, unspecified: Secondary | ICD-10-CM

## 2023-09-25 DIAGNOSIS — E119 Type 2 diabetes mellitus without complications: Secondary | ICD-10-CM

## 2023-10-02 ENCOUNTER — Other Ambulatory Visit: Payer: Self-pay | Admitting: Family Medicine

## 2023-10-10 ENCOUNTER — Ambulatory Visit
Admission: RE | Admit: 2023-10-10 | Discharge: 2023-10-10 | Disposition: A | Discharge status: Home / Self Care | Source: Ambulatory Visit | Attending: Family Medicine | Admitting: Family Medicine

## 2023-10-10 DIAGNOSIS — Z1231 Encounter for screening mammogram for malignant neoplasm of breast: Secondary | ICD-10-CM | POA: Diagnosis not present

## 2023-10-28 DIAGNOSIS — N390 Urinary tract infection, site not specified: Secondary | ICD-10-CM | POA: Diagnosis not present

## 2023-10-28 DIAGNOSIS — H60501 Unspecified acute noninfective otitis externa, right ear: Secondary | ICD-10-CM | POA: Diagnosis not present

## 2023-10-28 DIAGNOSIS — R350 Frequency of micturition: Secondary | ICD-10-CM | POA: Diagnosis not present

## 2023-11-15 ENCOUNTER — Other Ambulatory Visit: Payer: Self-pay | Admitting: Family Medicine

## 2023-11-24 DIAGNOSIS — E785 Hyperlipidemia, unspecified: Secondary | ICD-10-CM | POA: Diagnosis not present

## 2023-11-24 DIAGNOSIS — E119 Type 2 diabetes mellitus without complications: Secondary | ICD-10-CM | POA: Diagnosis not present

## 2023-11-25 LAB — MICROALBUMIN / CREATININE URINE RATIO
Creatinine, Urine: 87.8 mg/dL
Microalb/Creat Ratio: <3 mg/g{creat} (ref 0–29)
Microalbumin, Urine: <3 ug/mL

## 2023-11-25 LAB — CMP14+EGFR
ALT: 37 IU/L — ABNORMAL HIGH (ref 0–32)
AST: 33 IU/L (ref 0–40)
Albumin: 4.2 g/dL (ref 3.8–4.9)
Alkaline Phosphatase: 159 IU/L — ABNORMAL HIGH (ref 44–121)
BUN/Creatinine Ratio: 12 (ref 9–23)
BUN: 8 mg/dL (ref 6–24)
Bilirubin Total: 0.5 mg/dL (ref 0.0–1.2)
CO2: 28 mmol/L (ref 20–29)
Calcium: 10 mg/dL (ref 8.7–10.2)
Chloride: 91 mmol/L — ABNORMAL LOW (ref 96–106)
Creatinine, Ser: 0.67 mg/dL (ref 0.57–1.00)
Globulin, Total: 2.8 g/dL (ref 1.5–4.5)
Glucose: 135 mg/dL — ABNORMAL HIGH (ref 70–99)
Potassium: 4.3 mmol/L (ref 3.5–5.2)
Sodium: 135 mmol/L (ref 134–144)
Total Protein: 7 g/dL (ref 6.0–8.5)
eGFR: 101 mL/min/1.73 (ref >59)

## 2023-11-25 LAB — HEMOGLOBIN A1C
Est. average glucose Bld gHb Est-mCnc: 171 mg/dL
Hgb A1c MFr Bld: 7.6 % — ABNORMAL HIGH (ref 4.8–5.6)

## 2023-11-25 LAB — LIPID PANEL
Chol/HDL Ratio: 2.8 ratio (ref 0.0–4.4)
Cholesterol, Total: 130 mg/dL (ref 100–199)
HDL: 47 mg/dL (ref >39)
LDL Chol Calc (NIH): 57 mg/dL (ref 0–99)
Triglycerides: 154 mg/dL — ABNORMAL HIGH (ref 0–149)
VLDL Cholesterol Cal: 26 mg/dL (ref 5–40)

## 2023-11-28 ENCOUNTER — Ambulatory Visit: Payer: Self-pay | Admitting: Family Medicine

## 2023-11-29 ENCOUNTER — Ambulatory Visit (INDEPENDENT_AMBULATORY_CARE_PROVIDER_SITE_OTHER): Admitting: Family Medicine

## 2023-11-29 ENCOUNTER — Encounter: Payer: Self-pay | Admitting: Family Medicine

## 2023-11-29 ENCOUNTER — Encounter: Admitting: Family Medicine

## 2023-11-29 VITALS — BP 118/58 | HR 89 | Temp 97.7°F | Ht 64.76 in | Wt 153.0 lb

## 2023-11-29 DIAGNOSIS — E119 Type 2 diabetes mellitus without complications: Secondary | ICD-10-CM | POA: Diagnosis not present

## 2023-11-29 DIAGNOSIS — L609 Nail disorder, unspecified: Secondary | ICD-10-CM | POA: Diagnosis not present

## 2023-11-29 DIAGNOSIS — Z7984 Long term (current) use of oral hypoglycemic drugs: Secondary | ICD-10-CM

## 2023-11-29 DIAGNOSIS — Z0000 Encounter for general adult medical examination without abnormal findings: Secondary | ICD-10-CM

## 2023-11-29 MED ORDER — METFORMIN HCL ER (MOD) 1000 MG PO TB24
1000.0000 mg | ORAL_TABLET | Freq: Every day | ORAL | 3 refills | Status: DC
Start: 2023-11-29 — End: 2023-12-06

## 2023-11-29 NOTE — Progress Notes (Signed)
 Subjective:  Patient ID: Debbie Mcdaniel, female    DOB: 17-Oct-1965  Age: 58 y.o. MRN: [id number]  CC:   Chief Complaint  Patient presents with   Annual Exam    HPI:  58 year old female presents for an annual exam.  In regards to her preventative health care, patient is in need of several items.  She declines pneumococcal vaccine.  Declines HIV and hepatitis C screening.  She states that her eye exam is up-to-date.  Declines shingles vaccine.  Needs foot exam today.  Lung cancer screening up-to-date.  Mammogram up-to-date.  Cervical cancer screening up-to-date.  Colonoscopy up-to-date.  Patient is overall feeling well.  Blood pressure well-controlled.  Patient continues to smoke and is not interested in smoking cessation at this time.  She states that she is not in the right mindset to quit yet.  Additionally, patient has an area of concern.  She states that her left third toe has a dark area and she is concerned about this.  She would like me to examine it today.  Lastly, patient's recent labs were reviewed.  LDL at goal.  A1c has risen to 7.6.  Will discuss treatment options to get her A1c at goal.  She is currently on metformin  XR 500 mg once daily.  Patient Active Problem List   Diagnosis Date Noted   Nail abnormality 11/29/2023   Annual physical exam 11/27/2022   Neuropathy 11/27/2022   COPD (chronic obstructive pulmonary disease) (HCC) 05/20/2022   Current every day smoker 05/20/2022   Hyperlipidemia 05/20/2022   Type 2 diabetes mellitus without complication, without long-term current use of insulin (HCC) 05/20/2022   Benign essential hypertension 05/20/2022   Anxiety disorder 05/20/2022   Fatty liver 08/17/2015    Social Hx   Social History   Socioeconomic History   Marital status: Married    Spouse name: Not on file   Number of children: Not on file   Years of education: Not on file   Highest education level: 12th grade  Occupational History   Not on  file  Tobacco Use   Smoking status: Every Day    Current packs/day: 1.00    Average packs/day: 1 pack/day for 39.0 years (39.0 ttl pk-yrs)    Types: Cigarettes    Start date: 11/22/1984   Smokeless tobacco: Never  Substance and Sexual Activity   Alcohol use: Not on file   Drug use: Not on file   Sexual activity: Not on file  Other Topics Concern   Not on file  Social History Narrative   Not on file   Social Drivers of Health   Financial Resource Strain: Low Risk  (11/28/2023)   Overall Financial Resource Strain (CARDIA)    Difficulty of Paying Living Expenses: Not hard at all  Food Insecurity: No Food Insecurity (11/28/2023)   Hunger Vital Sign    Worried About Running Out of Food in the Last Year: Never true    Ran Out of Food in the Last Year: Never true  Transportation Needs: No Transportation Needs (11/28/2023)   PRAPARE - Administrator, Civil Service (Medical): No    Lack of Transportation (Non-Medical): No  Physical Activity: Inactive (11/28/2023)   Exercise Vital Sign    Days of Exercise per Week: 0 days    Minutes of Exercise per Session: Not on file  Stress: No Stress Concern Present (11/28/2023)   [employer] of Occupational Health - Occupational Stress Questionnaire    Feeling of Stress:  Only a little  Social Connections: Socially Isolated (11/28/2023)   Social Connection and Isolation Panel    Frequency of Communication with Friends and Family: Once a week    Frequency of Social Gatherings with Friends and Family: Once a week    Attends Religious Services: Never    Diplomatic Services operational officer: No    Attends Engineer, structural: Not on file    Marital Status: Married    Review of Systems Per HPI  Objective:  BP (!) 118/58   Pulse 89   Temp 97.7 F (36.5 C)   Ht 5' 4.76 (1.645 m)   Wt 153 lb (69.4 kg)   SpO2 96%   BMI 25.65 kg/m      11/29/2023    8:43 AM 11/25/2022    8:50 AM 05/20/2022    9:41 AM  BP/Weight   Systolic BP 118 142 112  Diastolic BP 58 79 50  Wt. (Lbs) 153 147.8 146  BMI 25.65 kg/m2 24.77 kg/m2 25.06 kg/m2    Physical Exam Vitals and nursing note reviewed.  Constitutional:      General: She is not in acute distress.    Appearance: Normal appearance.  HENT:     Head: Normocephalic and atraumatic.  Eyes:     General:        Right eye: No discharge.        Left eye: No discharge.     Conjunctiva/sclera: Conjunctivae normal.  Cardiovascular:     Rate and Rhythm: Normal rate and regular rhythm.  Pulmonary:     Effort: Pulmonary effort is normal.     Breath sounds: Normal breath sounds. No wheezing, rhonchi or rales.  Feet:     Comments: Left third toe -toenail with dark hyperpigmentation distally. Neurological:     Mental Status: She is alert.  Psychiatric:        Mood and Affect: Mood normal.        Behavior: Behavior normal.     Lab Results  Component Value Date   WBC 9.0 11/17/2022   HGB 15.0 11/17/2022   HCT 43.6 11/17/2022   PLT 184 11/17/2022   GLUCOSE 135 (H) 11/24/2023   CHOL 130 11/24/2023   TRIG 154 (H) 11/24/2023   HDL 47 11/24/2023   LDLCALC 57 11/24/2023   ALT 37 (H) 11/24/2023   AST 33 11/24/2023   NA 135 11/24/2023   K 4.3 11/24/2023   CL 91 (L) 11/24/2023   CREATININE 0.67 11/24/2023   BUN 8 11/24/2023   CO2 28 11/24/2023   HGBA1C 7.6 (H) 11/24/2023     Assessment & Plan:  Annual physical exam Assessment & Plan: Preventative health care updated in the EMR today.  Labs reviewed.  Recommended smoking cessation.  Patient not yet ready.   Nail abnormality Assessment & Plan: Referring to dermatology.  Suggest biopsy.  Orders: -     Ambulatory referral to Dermatology  Type 2 diabetes mellitus without complication, without long-term current use of insulin (HCC) Assessment & Plan: Uncontrolled/recent exacerbation.  A1c not at goal.  Increasing metformin  to 1000 mg.  Orders: -     metFORMIN  HCl ER (MOD); Take 1 tablet (1,000 mg  total) by mouth daily.  Dispense: 90 tablet; Refill: 3    Follow-up:  6 months  Mason Burleigh Bluford DO [facility]

## 2023-11-29 NOTE — Assessment & Plan Note (Signed)
 Preventative health care updated in the EMR today.  Labs reviewed.  Recommended smoking cessation.  Patient not yet ready.

## 2023-11-29 NOTE — Assessment & Plan Note (Signed)
 Uncontrolled/recent exacerbation.  A1c not at goal.  Increasing metformin  to 1000 mg.

## 2023-11-29 NOTE — Patient Instructions (Signed)
 Increase Metformin  to 1000 mg daily.  Referral placed.  Follow up in 6 months.

## 2023-11-29 NOTE — Assessment & Plan Note (Signed)
 Referring to dermatology.  Suggest biopsy.

## 2023-11-30 ENCOUNTER — Other Ambulatory Visit: Payer: Self-pay

## 2023-12-05 ENCOUNTER — Other Ambulatory Visit: Payer: Self-pay | Admitting: Family Medicine

## 2023-12-06 ENCOUNTER — Other Ambulatory Visit: Payer: Self-pay | Admitting: Family Medicine

## 2023-12-06 DIAGNOSIS — E119 Type 2 diabetes mellitus without complications: Secondary | ICD-10-CM

## 2023-12-06 MED ORDER — METFORMIN HCL ER 500 MG PO TB24: 1000.0000 mg | ORAL_TABLET | Freq: Every day | ORAL | 3 refills | Status: AC

## 2023-12-18 DIAGNOSIS — D225 Melanocytic nevi of trunk: Secondary | ICD-10-CM | POA: Diagnosis not present

## 2023-12-18 DIAGNOSIS — Z1283 Encounter for screening for malignant neoplasm of skin: Secondary | ICD-10-CM | POA: Diagnosis not present

## 2023-12-18 DIAGNOSIS — X32XXXD Exposure to sunlight, subsequent encounter: Secondary | ICD-10-CM | POA: Diagnosis not present

## 2023-12-18 DIAGNOSIS — C44712 Basal cell carcinoma of skin of right lower limb, including hip: Secondary | ICD-10-CM | POA: Diagnosis not present

## 2023-12-18 DIAGNOSIS — L570 Actinic keratosis: Secondary | ICD-10-CM | POA: Diagnosis not present

## 2023-12-18 DIAGNOSIS — T148XXA Other injury of unspecified body region, initial encounter: Secondary | ICD-10-CM | POA: Diagnosis not present

## 2023-12-18 DIAGNOSIS — D1801 Hemangioma of skin and subcutaneous tissue: Secondary | ICD-10-CM | POA: Diagnosis not present

## 2024-04-18 ENCOUNTER — Other Ambulatory Visit: Payer: Self-pay | Admitting: Acute Care

## 2024-04-18 DIAGNOSIS — Z122 Encounter for screening for malignant neoplasm of respiratory organs: Secondary | ICD-10-CM

## 2024-04-18 DIAGNOSIS — Z87891 Personal history of nicotine dependence: Secondary | ICD-10-CM

## 2024-04-18 DIAGNOSIS — F17210 Nicotine dependence, cigarettes, uncomplicated: Secondary | ICD-10-CM

## 2024-04-23 ENCOUNTER — Ambulatory Visit (HOSPITAL_COMMUNITY)

## 2024-05-08 ENCOUNTER — Ambulatory Visit (HOSPITAL_COMMUNITY)

## 2024-05-22 ENCOUNTER — Ambulatory Visit (HOSPITAL_COMMUNITY)
